# Patient Record
Sex: Female | Born: 1970 | Race: White | Hispanic: No | Smoking: Former smoker
Health system: Southern US, Community
[De-identification: ages and names within clinical notes are randomized; demographics above are authoritative.]

## PROBLEM LIST (undated history)

## (undated) DIAGNOSIS — F32A Depression, unspecified: Secondary | ICD-10-CM

## (undated) DIAGNOSIS — I10 Essential (primary) hypertension: Secondary | ICD-10-CM

## (undated) DIAGNOSIS — F329 Major depressive disorder, single episode, unspecified: Secondary | ICD-10-CM

## (undated) DIAGNOSIS — Z6836 Body mass index (BMI) 36.0-36.9, adult: Secondary | ICD-10-CM

## (undated) DIAGNOSIS — D219 Benign neoplasm of connective and other soft tissue, unspecified: Secondary | ICD-10-CM

## (undated) DIAGNOSIS — K219 Gastro-esophageal reflux disease without esophagitis: Secondary | ICD-10-CM

## (undated) DIAGNOSIS — R002 Palpitations: Secondary | ICD-10-CM

## (undated) DIAGNOSIS — M1990 Unspecified osteoarthritis, unspecified site: Secondary | ICD-10-CM

## (undated) DIAGNOSIS — E669 Obesity, unspecified: Secondary | ICD-10-CM

## (undated) DIAGNOSIS — M419 Scoliosis, unspecified: Secondary | ICD-10-CM

## (undated) DIAGNOSIS — J45909 Unspecified asthma, uncomplicated: Secondary | ICD-10-CM

## (undated) DIAGNOSIS — M719 Bursopathy, unspecified: Secondary | ICD-10-CM

## (undated) DIAGNOSIS — F419 Anxiety disorder, unspecified: Secondary | ICD-10-CM

## (undated) DIAGNOSIS — R079 Chest pain, unspecified: Secondary | ICD-10-CM

## (undated) HISTORY — DX: Bursopathy, unspecified: M71.9

## (undated) HISTORY — DX: Benign neoplasm of connective and other soft tissue, unspecified: D21.9

## (undated) HISTORY — PX: TONSILLECTOMY: SUR1361

## (undated) HISTORY — DX: Body mass index (BMI) 36.0-36.9, adult: Z68.36

## (undated) HISTORY — DX: Unspecified osteoarthritis, unspecified site: M19.90

## (undated) HISTORY — PX: APPENDECTOMY: SHX54

## (undated) HISTORY — DX: Scoliosis, unspecified: M41.9

## (undated) HISTORY — DX: Essential (primary) hypertension: I10

## (undated) HISTORY — DX: Obesity, unspecified: E66.9

## (undated) HISTORY — DX: Chest pain, unspecified: R07.9

## (undated) HISTORY — DX: Gastro-esophageal reflux disease without esophagitis: K21.9

## (undated) HISTORY — DX: Major depressive disorder, single episode, unspecified: F32.9

## (undated) HISTORY — DX: Unspecified asthma, uncomplicated: J45.909

## (undated) HISTORY — DX: Anxiety disorder, unspecified: F41.9

## (undated) HISTORY — DX: Depression, unspecified: F32.A

## (undated) HISTORY — DX: Palpitations: R00.2

---

## 2014-02-07 LAB — PREGNANCY, URINE: Preg Test, Ur: POSITIVE

## 2014-02-15 ENCOUNTER — Encounter: Payer: Self-pay | Admitting: *Deleted

## 2014-02-15 DIAGNOSIS — F32A Depression, unspecified: Secondary | ICD-10-CM

## 2014-02-15 DIAGNOSIS — O3410 Maternal care for benign tumor of corpus uteri, unspecified trimester: Principal | ICD-10-CM

## 2014-02-15 DIAGNOSIS — F419 Anxiety disorder, unspecified: Secondary | ICD-10-CM | POA: Insufficient documentation

## 2014-02-15 DIAGNOSIS — D259 Leiomyoma of uterus, unspecified: Secondary | ICD-10-CM | POA: Insufficient documentation

## 2014-02-15 DIAGNOSIS — M419 Scoliosis, unspecified: Secondary | ICD-10-CM | POA: Insufficient documentation

## 2014-02-15 DIAGNOSIS — F329 Major depressive disorder, single episode, unspecified: Secondary | ICD-10-CM

## 2014-02-15 DIAGNOSIS — O34219 Maternal care for unspecified type scar from previous cesarean delivery: Secondary | ICD-10-CM | POA: Insufficient documentation

## 2014-02-17 ENCOUNTER — Telehealth: Payer: Self-pay | Admitting: *Deleted

## 2014-02-17 NOTE — Telephone Encounter (Signed)
Patient call was transferred to me from the receptionist.  Patient afraid she is having a miscarriage and wants to talk about her options.  Patient hasn't been seen yet in our office and has appointment on 03/09/14.  Patient states she started spotting on Wednesday with some brown discharge and it has gotten some worse since that time.  States that with her previous pregnancies that she carried to term she never had any bleeding so this is very different.  Discussed with patient that she could go to [facility] for evaluation.  Explained that without evaluating her in person it would be impossible to determine whether she is having a miscarriage.  Patient states she is concerned about an emergency room charge.  Patient asked about D&C and the cost.  States she really wants to wait to see how things progress on their own.  Asks if she waits and keeps her appointment on 03/09/14 will the provider listen for the baby's heartbeat and possibly do an ultrasound.  I explained if there was any doubt as to the viability of the baby the provider would probably order an ultrasound.  I explained the process should she go to [facility].  We discussed how much bleeding would be too much and the need to come in.  Patient asks if she can call on Monday morning early to see if there have been any cancellations so she can be seen sooner.  Explained that would be fine.  States she is in [place] and is worried that her insurance may not pay if she comes to [place] for care.  Explained that is not the case and we will file her insurance for her once she is seen.  Patient also very apologetic for outburst with Antoinette, receptionist, this morning.  States she yelled and cussed at her.  States she did apologize to her.  I explained to patient that we understand her frustration and really appreciate her apology.

## 2014-02-21 ENCOUNTER — Telehealth: Payer: Self-pay | Admitting: *Deleted

## 2014-02-21 NOTE — Telephone Encounter (Signed)
Pt left message stating that she spoke with a nurse from our office last week. She is having vaginal bleeding and cramping and believes she passed the baby over the weekend. She has not gone to the hospital for evaluation. She has a scheduled appt on 7/9 and wants to know if there is a sooner appt available. She also stated that a detailed message can be left on her voice mail. I called pt back and left a detailed message. I stated that she may take ibuprofen 600mg  by mouth every 6hrs as needed with food for pain. Also if she has had a miscarriage, some vaginal bleeding is normal. If she is having heavy bleeding which is saturating 1 or more pads per hour for 3 consecutive hours she needs to be seen at a nearby emergency room or come to [facility]. We can wee her at our office on Thursday 6/25 @ 1315 and have scheduled this appt for her. If she has had ED care and no longer needs or desires the appt, please call to cancel.

## 2014-02-23 ENCOUNTER — Encounter: Payer: Self-pay | Admitting: Obstetrics & Gynecology

## 2014-02-23 ENCOUNTER — Ambulatory Visit (INDEPENDENT_AMBULATORY_CARE_PROVIDER_SITE_OTHER): Payer: Medicare HMO | Admitting: Obstetrics & Gynecology

## 2014-02-23 VITALS — BP 123/75 | HR 87 | Temp 100.0°F | Ht 68.0 in | Wt 211.1 lb

## 2014-02-23 DIAGNOSIS — O200 Threatened abortion: Secondary | ICD-10-CM

## 2014-02-23 NOTE — Progress Notes (Signed)
   Subjective:    Patient ID: Chelsea Bush, female    DOB: 10/10/1970, 43 y.o.   MRN: [id number]  HPI  43 yo [id number] here because of heavy bleeding with clots/tissue that started about 5 days ago and has greatly lightened up. Some cramping. She had a miscarriage with her first pregnancy and believes that this is also a miscarriage. She was diagnosed with an early pregnancy about 2 weeks ago at a doctor's office in [place]. She was scheduled to be seen in the high risk clinic in July.  Review of Systems     Objective:   Physical Exam Cervical os closed with a minimal of maroon blood NSSA, NT, no adnexal masses      Assessment & Plan:  Probable miscarriage I will check a type and screen, u/s, and QBHCG

## 2014-02-24 LAB — HCG, QUANTITATIVE, PREGNANCY: hCG, Beta Chain, Quant, S: 673.1 m[IU]/mL

## 2014-02-27 ENCOUNTER — Telehealth: Payer: Self-pay | Admitting: *Deleted

## 2014-02-27 NOTE — Telephone Encounter (Signed)
Patient called and left three messages stating that she has questions. Her doctor left suddenly and she wasn't through talking to her. She wants to know her lab results. Also would like to know if she can have sex or exercise. She also states that she has had a headache. Would like to know if this could be related to anemia in the past.

## 2014-02-27 NOTE — Telephone Encounter (Addendum)
6/29  1544  Called patient and left message to call us back if she still has concerns. 6/30  1535  This pt's concerns were addressed in another telephone encounter message.  Diane Day RNC

## 2014-02-28 ENCOUNTER — Ambulatory Visit (HOSPITAL_COMMUNITY): Payer: Medicare HMO

## 2014-02-28 ENCOUNTER — Telehealth: Payer: Self-pay | Admitting: *Deleted

## 2014-02-28 NOTE — Telephone Encounter (Addendum)
Pt left message stating that she got a return call from our office in response to her previous message. 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 She would like to know her results of recent lab test and also if we could refer her somewhere else to have the [place] since it is so expensive. She also stated that she has been having bad headaches. She requested a call back and that we may leave a detailed message. I returned pt's call and left the following message. I stated that her blood test for pregnancy hormone did have a small amount of pregnancy hormone. This could mean that she has a living, growing pregnancy which is very early or that she has had a miscarriage and the hormone is gradually becoming lower. We need to do another test of the hormone level in order to be sure. She may have it performed on Thursday of this week or any day next week. Please call and schedule an appt for lab draw only. This is extremely important to follow the level of the hormone to make a correct diagnosis. We cannot refer her to any other facility for [place] evaluation if our doctors are going to be making decisions about her care based on the [place] information. She must have the [place] performed at a [facility] facility. In regards to her H/A's, it is common to have them during pregnancy or if the pregnancy hormone is still present in her blood. She may take Tylenol or Ibuprofen according to package directions. If her headache is so severe that she cannot perform normal daily activities, she should be evaluated at an ED. She may call back for additional questions.

## 2014-03-01 ENCOUNTER — Telehealth: Payer: Self-pay | Admitting: General Practice

## 2014-03-01 NOTE — Telephone Encounter (Signed)
Patient called again. Wanted to know if its possible she isn't having a miscarriage and could just have fibroids. Requested that we call back and leave a detailed message.  Called patient back and left detailed message informing her that tomorrows blood test will help [place] to better tell exactly what is going on. Advised that we have to wait and see what the test shows before we can advise on what to do next.

## 2014-03-01 NOTE — Telephone Encounter (Signed)
Patient called and left message stating she would like some clarification on her results and she is wondering if she's still pregnant or not because she thinks she is starting to feel some movement, please call back. Called patient, no answer-left message that we are trying to return your phone call, please call us back at the clinics

## 2014-03-02 ENCOUNTER — Ambulatory Visit (HOSPITAL_COMMUNITY): Payer: BC Managed Care – PPO

## 2014-03-02 ENCOUNTER — Encounter: Payer: Self-pay | Admitting: Obstetrics & Gynecology

## 2014-03-02 ENCOUNTER — Other Ambulatory Visit: Payer: BC Managed Care – PPO

## 2014-03-02 DIAGNOSIS — O200 Threatened abortion: Secondary | ICD-10-CM

## 2014-03-03 LAB — HCG, QUANTITATIVE, PREGNANCY: hCG, Beta Chain, Quant, S: 35.2 m[IU]/mL

## 2014-03-06 ENCOUNTER — Telehealth: Payer: Self-pay | Admitting: *Deleted

## 2014-03-06 NOTE — Telephone Encounter (Signed)
Message copied by [name] on Mon Mar 06, 2014 11:41 AM ------      Message from: Chelsea Bush      Created: Fri Mar 03, 2014 10:42 AM       Please inform patient of decreasing pregnancy hormone. She needs to return in 1 week for repeat quant HCG ------

## 2014-03-06 NOTE — Telephone Encounter (Signed)
Message copied by Geanie Logan on Mon Mar 06, 2014 11:45 AM ------      Message from: Mora Bellman      Created: Fri Mar 03, 2014 10:42 AM       Please inform patient of decreasing pregnancy hormone. She needs to return in 1 week for repeat quant HCG ------

## 2014-03-06 NOTE — Telephone Encounter (Signed)
Patient left message that she is returning our call. Ok to leave message. She would also like to know the numbers from the [facility] recently done and the first one.

## 2014-03-06 NOTE — Telephone Encounter (Signed)
Attempted to call patient. No answer. Left message stating we are calling with results and to schedule a future appointment, please call clinic.

## 2014-03-07 ENCOUNTER — Ambulatory Visit (HOSPITAL_COMMUNITY)
Admission: RE | Admit: 2014-03-07 | Discharge: 2014-03-07 | Disposition: A | Discharge status: Home / Self Care | Payer: BC Managed Care – PPO | Source: Ambulatory Visit | Attending: Obstetrics & Gynecology | Admitting: Obstetrics & Gynecology

## 2014-03-07 DIAGNOSIS — O200 Threatened abortion: Secondary | ICD-10-CM

## 2014-03-07 DIAGNOSIS — D259 Leiomyoma of uterus, unspecified: Secondary | ICD-10-CM | POA: Insufficient documentation

## 2014-03-07 NOTE — Telephone Encounter (Signed)
Called patient and informed her of results. Patient has lab appointment scheduled for 03/09/14. Informed patient she should keep that appointment. Patient asked if she should continue to go to U/S today. Informed patient that Dr. Hulan Fray had ordered it and she should to ensure she has passed all POC. Patient verbalized understanding. No further questions or concerns.

## 2014-03-09 ENCOUNTER — Encounter: Payer: Self-pay | Admitting: Obstetrics & Gynecology

## 2014-03-09 ENCOUNTER — Ambulatory Visit (HOSPITAL_COMMUNITY): Payer: BC Managed Care – PPO

## 2014-03-09 ENCOUNTER — Other Ambulatory Visit: Payer: BC Managed Care – PPO

## 2014-03-15 ENCOUNTER — Telehealth: Payer: Self-pay

## 2014-03-15 NOTE — Telephone Encounter (Signed)
Patient called requesting results of u/s. Called patient and informed her of results of ultrasound but explained that we would still like to see her come in for another HCG lab draw to ensure level is <5. Patient states she has no insurance and is going to be "hit hard" for ultrasound and would like to know if she could just take a home pregnancy test. Informed patient that a negative pregnancy test would be reassuring, however, we still recommend that she come in for the lab draw. Patient states she does not think she will at this time but that she will take a pregnancy test at home and if still presenting positive will consider coming in. Explains she lives in [place], [place] and would have to take work off-- is uninsured-- and states she just cannot afford it. Informed patient I would document she is refusing repeat lab draw but informed to call if she changes her mind. Patient verbalized understanding.

## 2014-03-20 ENCOUNTER — Ambulatory Visit: Payer: Medicare HMO | Admitting: Obstetrics & Gynecology

## 2014-07-03 ENCOUNTER — Encounter: Payer: Self-pay | Admitting: Obstetrics & Gynecology

## 2014-12-21 ENCOUNTER — Encounter (HOSPITAL_COMMUNITY): Payer: Self-pay | Admitting: *Deleted

## 2015-04-30 ENCOUNTER — Encounter: Payer: Self-pay | Admitting: Obstetrics & Gynecology

## 2015-04-30 ENCOUNTER — Ambulatory Visit (INDEPENDENT_AMBULATORY_CARE_PROVIDER_SITE_OTHER): Payer: BLUE CROSS/BLUE SHIELD | Admitting: Obstetrics & Gynecology

## 2015-04-30 VITALS — BP 127/61 | HR 70 | Temp 98.5°F

## 2015-04-30 DIAGNOSIS — R102 Pelvic and perineal pain: Secondary | ICD-10-CM | POA: Diagnosis not present

## 2015-04-30 DIAGNOSIS — N3001 Acute cystitis with hematuria: Secondary | ICD-10-CM | POA: Diagnosis not present

## 2015-04-30 LAB — POCT URINALYSIS DIP (DEVICE)
BILIRUBIN URINE: NEGATIVE
Glucose, UA: NEGATIVE mg/dL
KETONES UR: NEGATIVE mg/dL
Leukocytes, UA: NEGATIVE
NITRITE: NEGATIVE
PH: 5.5 (ref 5.0–8.0)
Protein, ur: NEGATIVE mg/dL
Specific Gravity, Urine: 1.01 (ref 1.005–1.030)
Urobilinogen, UA: 0.2 mg/dL (ref 0.0–1.0)

## 2015-04-30 MED ORDER — CEPHALEXIN 500 MG PO CAPS: 500.0000 mg | ORAL_CAPSULE | Freq: Two times a day (BID) | ORAL | Status: DC

## 2015-04-30 NOTE — Progress Notes (Signed)
Patient ID: Chelsea Bush, female   DOB: 1971-02-12, 44 y.o.   MRN: [id number] Patient is complaining of bladder pain since Friday 8/26, began taking refill prescription for cipro, unkn dosage, bid. Increased urinary leakage per patient.

## 2015-04-30 NOTE — Patient Instructions (Signed)

## 2015-05-01 LAB — URINE CULTURE
Colony Count: NO GROWTH
ORGANISM ID, BACTERIA: NO GROWTH

## 2015-05-03 ENCOUNTER — Encounter: Payer: Self-pay | Admitting: Obstetrics & Gynecology

## 2015-05-03 NOTE — Progress Notes (Signed)
Patient ID: Chelsea Bush, female   DOB: June 04, 1971, 44 y.o.   MRN: [id number] History:  44 y.o. [id number] here today for eval of pelvic pain.  Pt reports a h/o freq UTIs.  She reports that she had Cipro at home and started it 2 days prev with only min relief of sx.  She reports a h/o urine leakage that she thought was just related to having a baby since her mom had similar sx.  She reports dysuria and urinary freq.  She reports that the pain is much worse if her child accidentally kicks or hits her abd.  No fever or chills  The following portions of the patient's history were reviewed and updated as appropriate: allergies, current medications, past family history, past medical history, past social history, past surgical history and problem list.  Review of Systems:  Pertinent items are noted in HPI.  Objective:  Physical Exam Blood pressure 127/61, pulse 70, temperature 98.5 F (36.9 C), last menstrual period 04/20/2015, unknown if currently breastfeeding. Gen: NAD Abd: Soft, suprapubic tenderness to palpation; nondistended   Labs and Imaging UA + hemoglobin; neg nitrates or leuk  Assessment & Plan:  New onset urinary sx- suspect UTI.  Pt has been on Cipro 2-3 days.  Suspect that there could be partial treatment possibly due to resistance.  Will obtain urine cx Keflex 500 mg bid x 7 days Pelvic pain and incontinence rec eval after UTI is cleared.  Rec that pt f/u with Dr. Hulan Fray who does most of our incontinence procedures   Mekala Winger L. Harraway-Smith, M.D., Cherlynn June

## 2015-05-10 IMAGING — US US PELVIS COMPLETE
1 series · 14 of 25 positions shown · non-contrast
Comparison: None

CLINICAL DATA: History of fibroids. Suspect spontaneous abortion.
Evaluate for retained products of conception.



[Series 1: us pelvis complete · 14 of 55 slices shown]
[im 1/55]
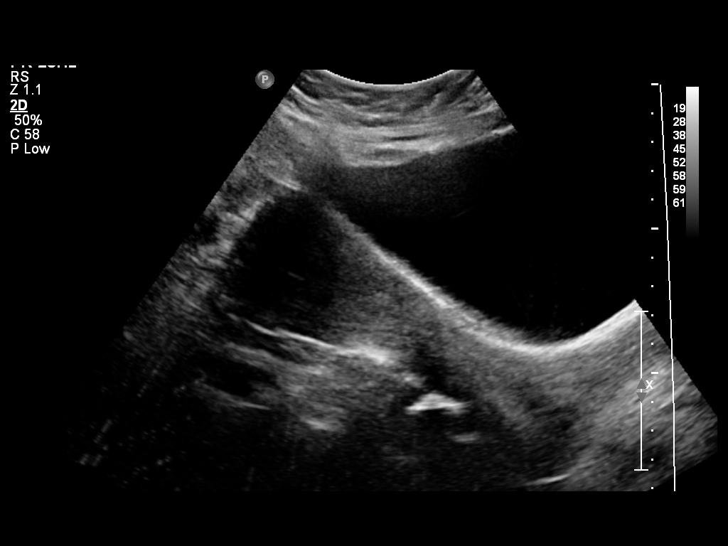
[im 5/55]
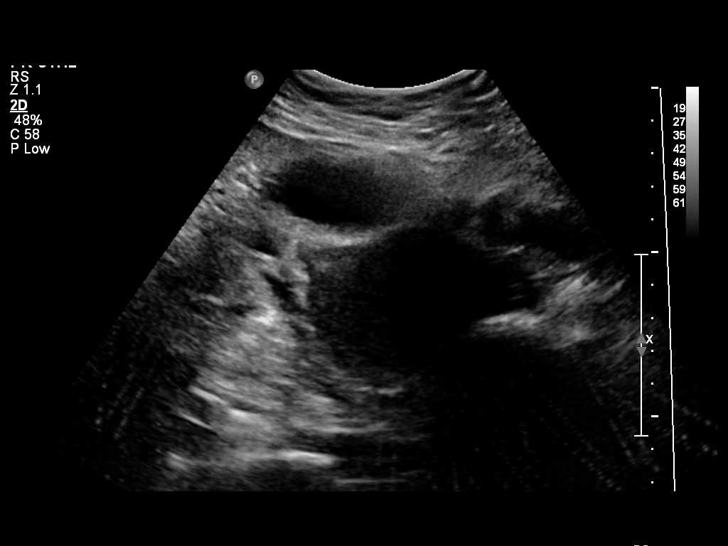
[im 10/55]
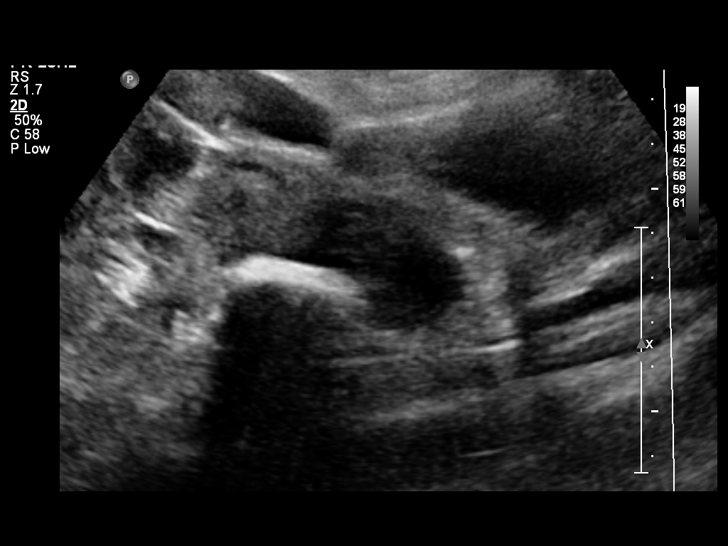
[im 14/55]
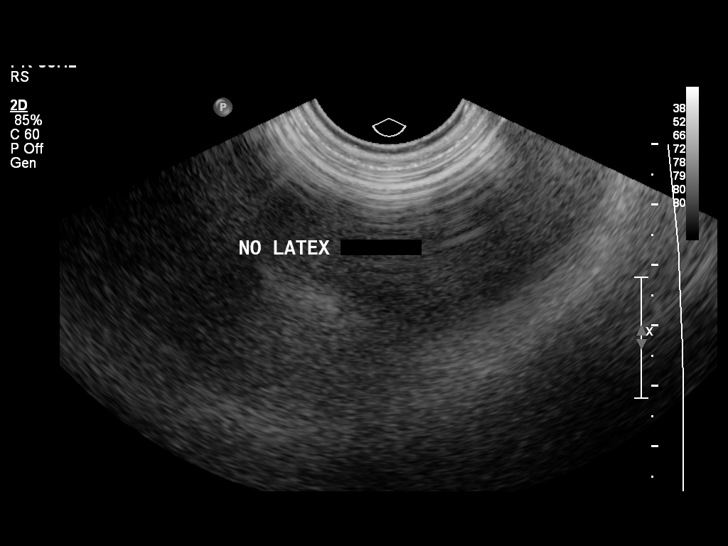
[im 19/55]
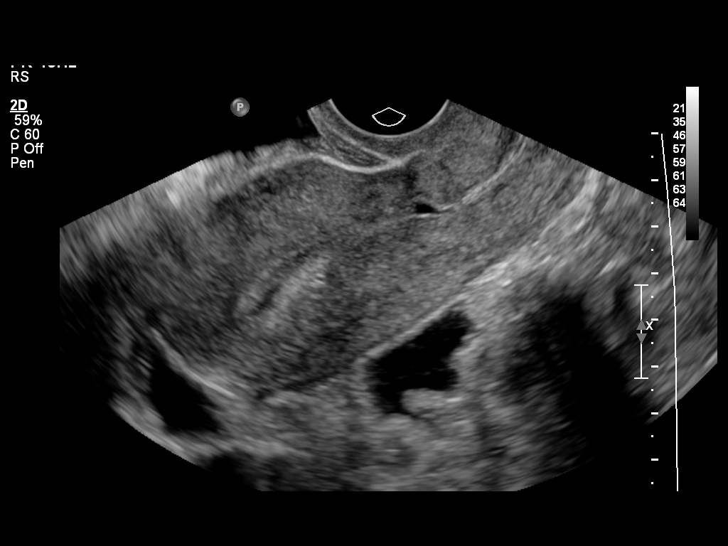
[im 21/55]
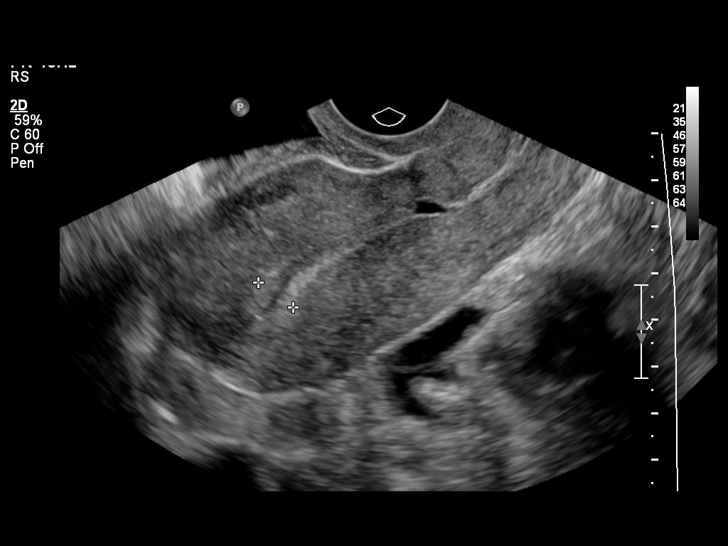
[im 25/55]
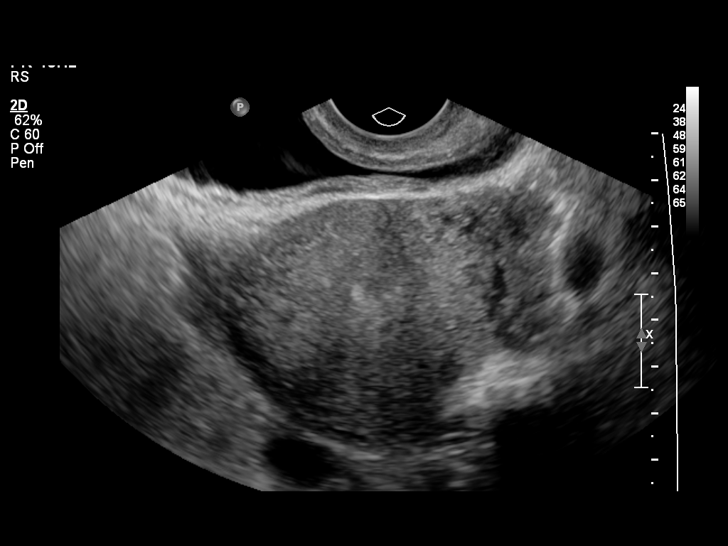
[im 30/55]
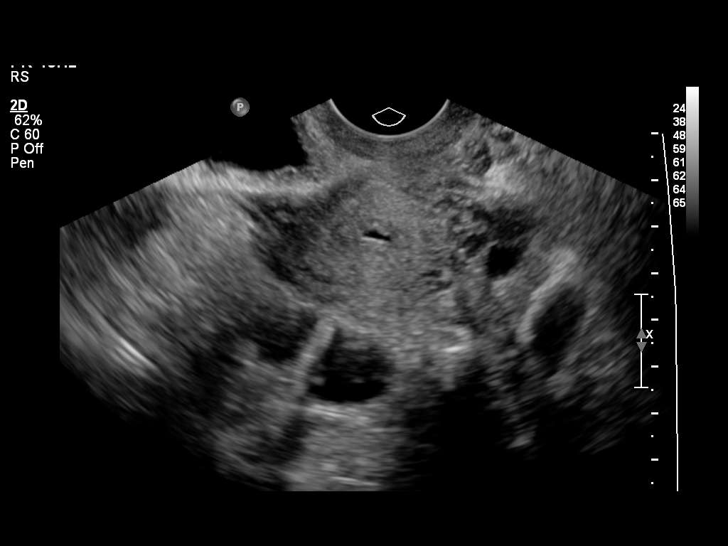
[im 34/55]
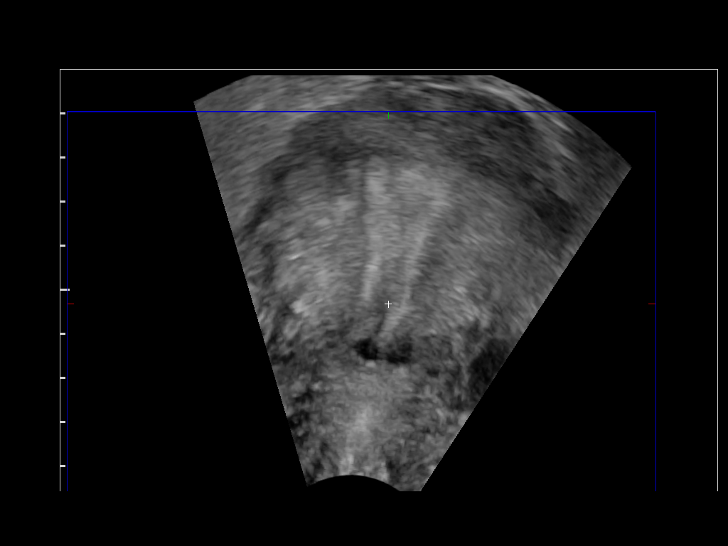
[im 37/55]
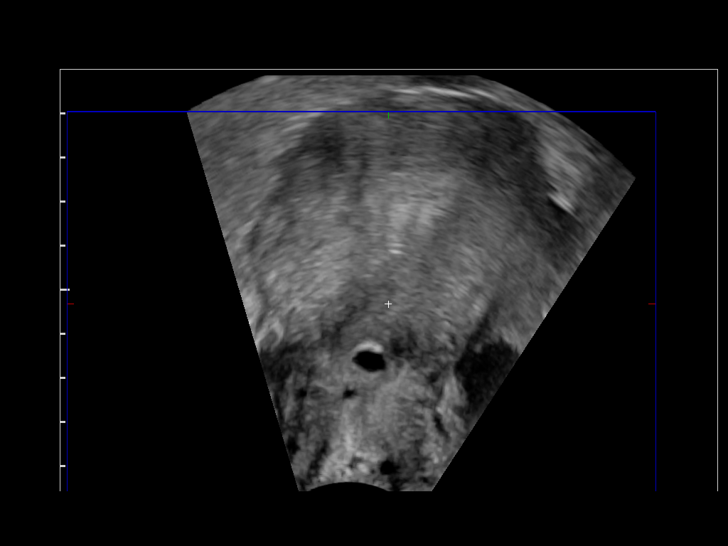
[im 41/55]
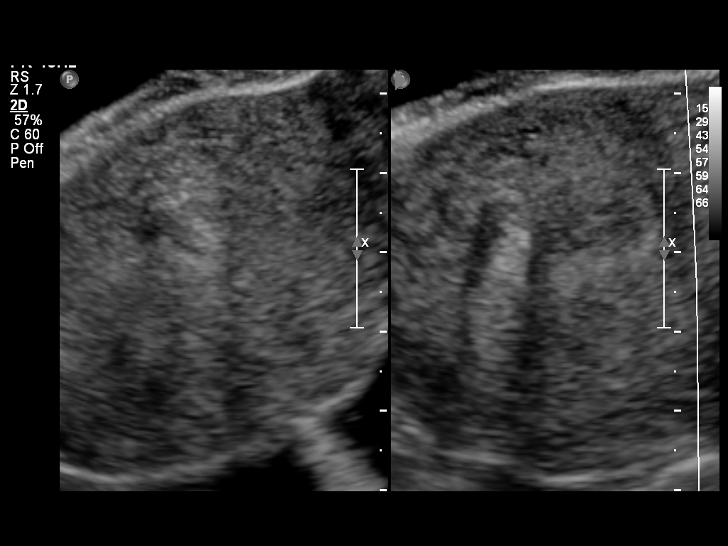
[im 46/55]
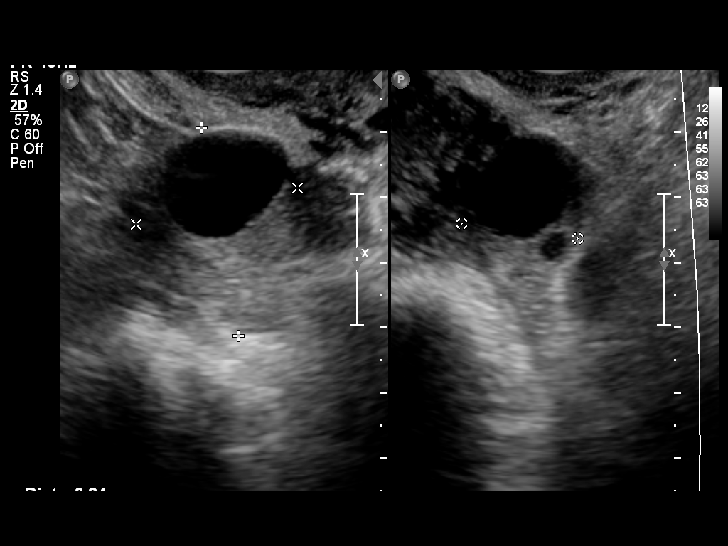
[im 50/55]
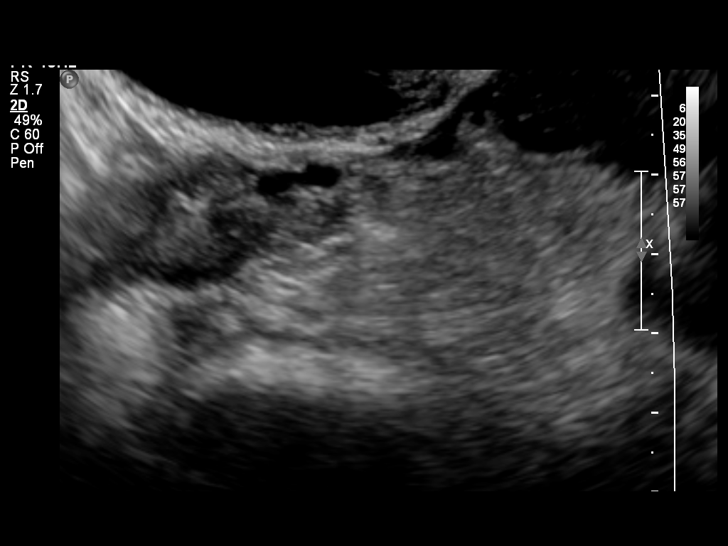
[im 55/55]
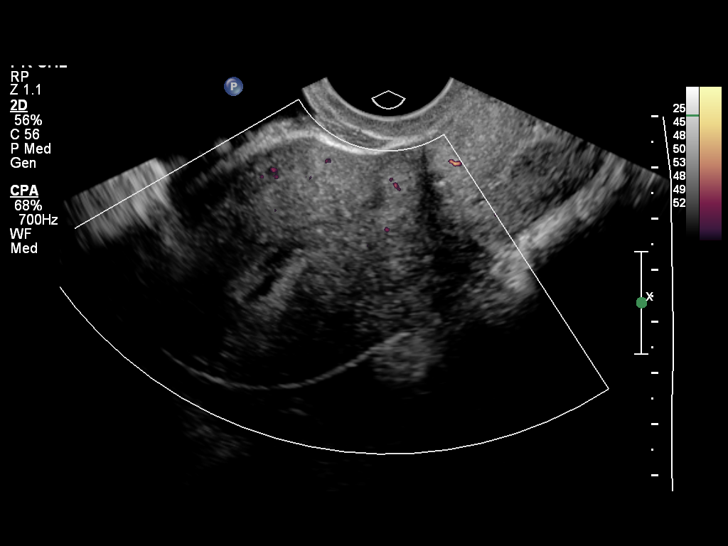

[14 of 25 positions shown; findings below may reference images not displayed]

FINDINGS: Uterus

Measurements: 11.4 x 4.5 x 6.3 cm.. Fibroid within the right lateral
myometrium measures 1.3 x 0.8 x 0.9 cm.

Endometrium

Thickness: 10 mm.. Small amount of anechoic fluid is identified
within the lower uterine segment.

Right ovary

Measurements: 3.3 x 2.5 x 1.8 cm. Normal appearance/no adnexal mass.

Left ovary

Measurements: 1.8 x 1.3 x 1.5 cm. Normal appearance/no adnexal mass.

Other findings

No free fluid.
IMPRESSION: 1. No evidence for retained products of conception.
2. A small amount of anechoic fluid is noted within the lower
uterine segment.

## 2015-05-21 ENCOUNTER — Ambulatory Visit: Payer: BLUE CROSS/BLUE SHIELD | Admitting: Obstetrics & Gynecology

## 2022-11-20 ENCOUNTER — Ambulatory Visit: Payer: BLUE CROSS/BLUE SHIELD | Attending: Internal Medicine | Admitting: Internal Medicine

## 2022-11-20 ENCOUNTER — Encounter: Payer: Self-pay | Admitting: Internal Medicine

## 2022-11-20 VITALS — BP 118/68 | HR 66 | Ht 67.0 in | Wt 241.0 lb

## 2022-11-20 DIAGNOSIS — R072 Precordial pain: Secondary | ICD-10-CM | POA: Diagnosis not present

## 2022-11-20 DIAGNOSIS — F419 Anxiety disorder, unspecified: Secondary | ICD-10-CM

## 2022-11-20 DIAGNOSIS — I10 Essential (primary) hypertension: Secondary | ICD-10-CM | POA: Insufficient documentation

## 2022-11-20 DIAGNOSIS — E782 Mixed hyperlipidemia: Secondary | ICD-10-CM | POA: Diagnosis not present

## 2022-11-20 DIAGNOSIS — R0601 Orthopnea: Secondary | ICD-10-CM | POA: Insufficient documentation

## 2022-11-20 MED ORDER — SPIRONOLACTONE 25 MG PO TABS: 25.0000 mg | ORAL_TABLET | Freq: Every day | ORAL | 3 refills | Status: AC

## 2022-11-20 MED ORDER — METOPROLOL TARTRATE 100 MG PO TABS: ORAL_TABLET | ORAL | 0 refills | Status: AC

## 2022-11-20 NOTE — Progress Notes (Signed)
Cardiology Office Note:    Date:  11/20/2022   ID:  Chelsea Bush, DOB Feb 13, 1971, MRN [id number]  PCP:  Neale Burly, MD   [facility] Providers Cardiologist:  None     Referring MD: Neale Burly, MD   CC: Chest pain Consulted for the evaluation of chest pain  at the behest of Dr. Sherrie Sport  History of Present Illness:    Chelsea Bush is a 52 y.o. female with a hx of HTN, morbid obesity, and HLD, who presents for chest pain evaluation.  Patient notes that she is feeling poorly.   Since she had the COVID-19 vaccine, she has had chest pain.   It feels like a burning chest pain.  Sometimes it is going down her arm.  The pain is constant and comes and goes a few hours and a few days.  Improved on taking Omega 3 Fatty acids and ASA.  Worse with stress. Started walking with no chest pain  Notes orthopnea.  Feels like she is wheezing and is propped up on inhalers.  SOB predominately laying down.   No cough.  Rare ankle swelling. Worse with alcohol. She is trying to change her diet and cut down caffeine and [facility].   Patient reports prior cardiac testing including normal TSH.  She has new palpitations and slow heart rates that occur every two weeks.  Notes that she gets leg pain independent of exertion.  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  Past Medical History:  Diagnosis Date   Anxiety    Arthritis    Asthma    BMI 36.0-36.9,adult    Bursitis    Chest pain    Depression    Fibroids    GERD (gastroesophageal reflux disease)    HTN (hypertension)    Obesity    Palpitations    Scoliosis     Past Surgical History:  Procedure Laterality Date   APPENDECTOMY     CESAREAN SECTION     TONSILLECTOMY      Current Medications: Current Meds  Medication Sig   albuterol (VENTOLIN HFA) 108 (90 Base) MCG/ACT inhaler Inhale 1 puff into the lungs 4 (four) times daily as needed for wheezing or shortness of breath.   budesonide-formoterol (SYMBICORT)  160-4.5 MCG/ACT inhaler Inhale 2 puffs into the lungs daily.   clonazePAM (KLONOPIN) 0.5 MG tablet 0.25 mg daily.   EPINEPHrine 0.3 mg/0.3 mL IJ SOAJ injection as needed for anaphylaxis.   famotidine (PEPCID) 40 MG tablet Take 40 mg by mouth daily as needed for heartburn or indigestion.   metoprolol tartrate (LOPRESSOR) 100 MG tablet Take 2 hours prior to Cardiac CT   montelukast (SINGULAIR) 10 MG tablet Take 10 mg by mouth at bedtime.   omeprazole (PRILOSEC) 20 MG capsule Take 20 mg by mouth as needed (heartburn).   oxyCODONE-acetaminophen (PERCOCET) 7.5-325 MG per tablet Take 1 tablet by mouth 4 (four) times daily as needed for pain.   Semaglutide-Weight Management (WEGOVY) 0.25 MG/0.5ML SOAJ Inject 0.25 mg into the skin every 7 (seven) days.   [DISCONTINUED] cephALEXin (KEFLEX) 500 MG capsule Take 1 capsule (500 mg total) by mouth 2 (two) times daily.   [DISCONTINUED] ciprofloxacin (CIPRO) 500 MG tablet Take 500 mg by mouth 2 (two) times daily.   [DISCONTINUED] diphenhydrAMINE (BENADRYL) 12.5 MG/5ML liquid Take 6.25 mg by mouth daily as needed.   [DISCONTINUED] Docosahexaenoic Acid (DHA PO) Take 2 tablets by mouth daily.   [DISCONTINUED] escitalopram (LEXAPRO) 10 MG tablet  Take 10 mg by mouth daily.   [DISCONTINUED] hydrocortisone (ANUSOL-HC) 25 MG suppository Place 25 mg rectally 2 (two) times daily.   [DISCONTINUED] metoprolol succinate (TOPROL-XL) 25 MG 24 hr tablet Take 25 mg by mouth daily.   [DISCONTINUED] Prenatal Vit-Fe Fumarate-FA (PRENATAL VITAMINS PLUS) 27-1 MG TABS Take 1 tablet by mouth daily.   [DISCONTINUED] spironolactone (ALDACTONE) 25 MG tablet Take 25 mg by mouth daily.     Allergies:   Erythromycin   Social History   Socioeconomic History   Marital status: Divorced    Spouse name: Not on file   Number of children: Not on file   Years of education: Not on file   Highest education level: Not on file  Occupational History   Not on file  Tobacco Use   Smoking  status: Former    Types: Cigarettes    Quit date: 09/01/2001    Years since quitting: 21.2   Smokeless tobacco: Never  Substance and Sexual Activity   Alcohol use: Yes    Comment: occasional   Drug use: No   Sexual activity: Yes    Birth control/protection: None  Other Topics Concern   Not on file  Social History Narrative   Not on file   Social Determinants of Health   Financial Resource Strain: Not on file  Food Insecurity: Not on file  Transportation Needs: Not on file  Physical Activity: Not on file  Stress: Not on file  Social Connections: Not on file   Social: son has unique health needs and needs medical care   Family History: The patient's family history includes Hyperlipidemia in her mother; Hypertension in her father.  ROS:   Please see the history of present illness.     All other systems reviewed and are negative.  EKGs/Labs/Other Studies Reviewed:    The following studies were reviewed today:  EKG:  EKG is  ordered today.  The ekg ordered today demonstrates  11/20/2022: SR rate 66    Recent Labs: No results found for requested labs within last 365 days.  Recent Lipid Panel No results found for: "CHOL", "TRIG", "HDL", "CHOLHDL", "VLDL", "LDLCALC", "LDLDIRECT"  Physical Exam:    VS:  BP 118/68   Pulse 66   Ht 5\' 7"  (1.702 m)   Wt 241 lb (109.3 kg)   SpO2 98%   BMI 37.75 kg/m     Wt Readings from Last 3 Encounters:  11/20/22 241 lb (109.3 kg)  02/23/14 211 lb 1.6 oz (95.8 kg)    GEN: No distress morbid obesity HEENT: Normal NECK: No JVD CARDIAC: RRR, no murmurs, rubs, gallops RESPIRATORY:  Clear to auscultation without rales, wheezing or rhonchi  ABDOMEN: Soft, non-tender, non-distended MUSCULOSKELETAL:  +1 bilateral edema; No deformity  SKIN: Warm and dry NEUROLOGIC:  Alert and oriented x 3 PSYCHIATRIC:  anxious affect and mood  ASSESSMENT:    1. Precordial pain   2. Orthopnea   3. Anxiety   4. Mixed hyperlipidemia   5. Essential  hypertension   6. Morbid obesity ([place])     PLAN:    Chest pain - will get CCTA  Isolated orthopnea - echo with Bubble  - and BNP  Palpitations and bradycardia - if other testing looks good and symptoms persistent, will send ziopatch 7 days   Leg pain  - has good distal pulses, if high CAC burden will get duplex  HLD - discuss lifestyle interventions  Morbid obesity HTN - On Wegovy with PCP - off MRA  was off      Medication Adjustments/Labs and Tests Ordered: Current medicines are reviewed at length with the patient today.  Concerns regarding medicines are outlined above.  Orders Placed This Encounter  Procedures   CT CORONARY MORPH W/CTA COR W/SCORE W/CA W/CM &/OR WO/CM   Basic metabolic panel   Pro b natriuretic peptide (BNP)   EKG 12-Lead   ECHOCARDIOGRAM LIMITED BUBBLE STUDY   Meds ordered this encounter  Medications   spironolactone (ALDACTONE) 25 MG tablet    Sig: Take 1 tablet (25 mg total) by mouth daily.    Dispense:  90 tablet    Refill:  3   metoprolol tartrate (LOPRESSOR) 100 MG tablet    Sig: Take 2 hours prior to Cardiac CT    Dispense:  1 tablet    Refill:  0    Patient Instructions  Medication Instructions:  Your physician has recommended you make the following change in your medication:  RESTART: spironolactone (Aldactone) 25 mg by mouth once daily  *If you need a refill on your cardiac medications before your next appointment, please call your pharmacy*   Lab Work: TODAY: BMP, BNP  If you have labs (blood work) drawn today and your tests are completely normal, you will receive your results only by: [place] (if you have MyChart) OR A paper copy in the mail If you have any lab test that is abnormal or we need to change your treatment, we will call you to review the results.   Testing/Procedures: Your physician has requested that you have cardiac CT. Cardiac computed tomography (CT) is a painless test that uses an x-ray machine  to take clear, detailed pictures of your heart. For further information please visit [email or web address].[email or web address]. Please follow instruction sheet as given.   Your physician has requested that you have an Echocardiogram Bubble Study.   Follow-Up: At [facility], you and your health needs are our priority.  As part of our continuing mission to provide you with exceptional heart care, we have created designated Provider Care Teams.  These Care Teams include your primary Cardiologist (physician) and Advanced Practice Providers (APPs -  Physician Assistants and Nurse Practitioners) who all work together to provide you with the care you need, when you need it.  We recommend signing up for the patient portal called "MyChart".  Sign up information is provided on this After Visit Summary.  MyChart is used to connect with patients for Virtual Visits (Telemedicine).  Patients are able to view lab/test results, encounter notes, upcoming appointments, etc.  Non-urgent messages can be sent to your provider as well.   To learn more about what you can do with MyChart, go to [email or web address].    Your next appointment:   3-4 month(s)  Provider:   Nicholes Rough, PA-C, Melina Copa, PA-C, or Ermalinda Barrios, PA-C      Other Instructions   Your cardiac CT will be scheduled at one of the below locations:   [facility] [address] [place], [place] [address] [phone]  [place] [address] [place], [place] [address] [phone]  [facility] [place], [place] [address] [phone]  If scheduled at [facility], please arrive at the [facility] and Children's Entrance (Entrance C2) of [facility] 30 minutes prior to test start time. You can use the FREE valet parking offered at entrance C (encouraged to control the  heart rate for the test)  Proceed to the [facility]  Radiology Department (first floor) to check-in and test prep.  All radiology patients and guests should use entrance C2 at [facility], accessed from [facility], even though the hospital's physical address listed is [address]    If scheduled at [facility] or [facility], please arrive 15 mins early for check-in and test prep.   Please follow these instructions carefully (unless otherwise directed):  Hold all erectile dysfunction medications at least 3 days (72 hrs) prior to test. (Ie viagra, cialis, sildenafil, tadalafil, etc) We will administer nitroglycerin during this exam.   On the Night Before the Test: Be sure to Drink plenty of water. Do not consume any caffeinated/decaffeinated beverages or chocolate 12 hours prior to your test. Do not take any antihistamines 12 hours prior to your test.- - Singular   On the Day of the Test: Drink plenty of water until 1 hour prior to the test. Do not eat any food 1 hour prior to test. You may take your regular medications prior to the test.  Take metoprolol (Lopressor) 100 mg by mouth two hours prior to test. If you take Furosemide/Hydrochlorothiazide/Spironolactone, please HOLD on the morning of the test. FEMALES- please wear underwire-free bra if available, avoid dresses & tight clothing       After the Test: Drink plenty of water. After receiving IV contrast, you may experience a mild flushed feeling. This is normal. On occasion, you may experience a mild rash up to 24 hours after the test. This is not dangerous. If this occurs, you can take Benadryl 25 mg and increase your fluid intake. If you experience trouble breathing, this can be serious. If it is severe call 911 IMMEDIATELY. If it is mild, please call our office. If you take any of these medications: Glipizide/Metformin, Avandament, Glucavance, please do not take 48 hours after completing test unless  otherwise instructed.  We will call to schedule your test 2-4 weeks out understanding that some insurance companies will need an authorization prior to the service being performed.   For non-scheduling related questions, please contact the cardiac imaging nurse navigator should you have any questions/concerns: Marchia Bond, Cardiac Imaging Nurse Navigator Gordy Clement, Cardiac Imaging Nurse Navigator [place] [facility] Direct Office Dial: [phone]   For scheduling needs, including cancellations and rescheduling, please call [place], [phone].     Signed, Werner Lean, MD  11/20/2022 12:23 PM    [place]

## 2022-11-20 NOTE — Patient Instructions (Signed)
Medication Instructions:  Your physician has recommended you make the following change in your medication:  RESTART: spironolactone (Aldactone) 25 mg by mouth once daily  *If you need a refill on your cardiac medications before your next appointment, please call your pharmacy*   Lab Work: TODAY: BMP, BNP  If you have labs (blood work) drawn today and your tests are completely normal, you will receive your results only by: [place] (if you have MyChart) OR A paper copy in the mail If you have any lab test that is abnormal or we need to change your treatment, we will call you to review the results.   Testing/Procedures: Your physician has requested that you have cardiac CT. Cardiac computed tomography (CT) is a painless test that uses an x-ray machine to take clear, detailed pictures of your heart. For further information please visit [email or web address].[email or web address]. Please follow instruction sheet as given.   Your physician has requested that you have an Echocardiogram Bubble Study.   Follow-Up: At [facility], you and your health needs are our priority.  As part of our continuing mission to provide you with exceptional heart care, we have created designated Provider Care Teams.  These Care Teams include your primary Cardiologist (physician) and Advanced Practice Providers (APPs -  Physician Assistants and Nurse Practitioners) who all work together to provide you with the care you need, when you need it.  We recommend signing up for the patient portal called "MyChart".  Sign up information is provided on this After Visit Summary.  MyChart is used to connect with patients for Virtual Visits (Telemedicine).  Patients are able to view lab/test results, encounter notes, upcoming appointments, etc.  Non-urgent messages can be sent to your provider as well.   To learn more about what you can do with MyChart, go to [email or web address].    Your next appointment:   3-4  month(s)  Provider:   Nicholes Rough, PA-C, Melina Copa, PA-C, or Ermalinda Barrios, PA-C      Other Instructions   Your cardiac CT will be scheduled at one of the below locations:   [facility] [address] [place], [place] [address] [phone]  [place] [address] [place], [place] [address] [phone]  [facility] [place], [place] [address] [phone]  If scheduled at [facility], please arrive at the [facility] and Children's Entrance (Entrance C2) of [facility] 30 minutes prior to test start time. You can use the FREE valet parking offered at entrance C (encouraged to control the heart rate for the test)  Proceed to the [facility] Radiology Department (first floor) to check-in and test prep.  All radiology patients and guests should use entrance C2 at [facility], accessed from [facility], even though the hospital's physical address listed is [address].    If scheduled at [facility] or [facility], please arrive 15 mins early for check-in and test prep.   Please follow these instructions carefully (unless otherwise directed):  Hold all erectile dysfunction medications at least 3 days (72 hrs) prior to test. (Ie viagra, cialis, sildenafil, tadalafil, etc) We will administer nitroglycerin during this exam.   On the Night Before the Test: Be sure to Drink plenty of water. Do not consume any caffeinated/decaffeinated beverages or chocolate 12 hours prior to your test. Do not take any antihistamines 12  hours prior to your test.- - Singular   On the Day of the Test: Drink plenty of water until 1 hour prior to the test. Do not eat any food 1 hour prior to test. You may take your regular medications prior to the test.  Take metoprolol (Lopressor) 100 mg by  mouth two hours prior to test. If you take Furosemide/Hydrochlorothiazide/Spironolactone, please HOLD on the morning of the test. FEMALES- please wear underwire-free bra if available, avoid dresses & tight clothing       After the Test: Drink plenty of water. After receiving IV contrast, you may experience a mild flushed feeling. This is normal. On occasion, you may experience a mild rash up to 24 hours after the test. This is not dangerous. If this occurs, you can take Benadryl 25 mg and increase your fluid intake. If you experience trouble breathing, this can be serious. If it is severe call 911 IMMEDIATELY. If it is mild, please call our office. If you take any of these medications: Glipizide/Metformin, Avandament, Glucavance, please do not take 48 hours after completing test unless otherwise instructed.  We will call to schedule your test 2-4 weeks out understanding that some insurance companies will need an authorization prior to the service being performed.   For non-scheduling related questions, please contact the cardiac imaging nurse navigator should you have any questions/concerns: Marchia Bond, Cardiac Imaging Nurse Navigator Gordy Clement, Cardiac Imaging Nurse Navigator [place] [facility] Direct Office Dial: [phone]   For scheduling needs, including cancellations and rescheduling, please call [place], [phone].

## 2022-11-21 LAB — BASIC METABOLIC PANEL
BUN/Creatinine Ratio: 15 (ref 9–23)
BUN: 12 mg/dL (ref 6–24)
CO2: 26 mmol/L (ref 20–29)
Calcium: 9.7 mg/dL (ref 8.7–10.2)
Chloride: 101 mmol/L (ref 96–106)
Creatinine, Ser: 0.81 mg/dL (ref 0.57–1.00)
Glucose: 91 mg/dL (ref 70–99)
Potassium: 4.3 mmol/L (ref 3.5–5.2)
Sodium: 141 mmol/L (ref 134–144)
eGFR: 88 mL/min/{1.73_m2} (ref >59)

## 2022-11-21 LAB — PRO B NATRIURETIC PEPTIDE: NT-Pro BNP: <36 pg/mL (ref 0–249)

## 2022-11-25 ENCOUNTER — Telehealth (HOSPITAL_COMMUNITY): Payer: Self-pay | Admitting: Emergency Medicine

## 2022-11-25 ENCOUNTER — Encounter (HOSPITAL_COMMUNITY): Payer: Self-pay

## 2022-11-25 DIAGNOSIS — R079 Chest pain, unspecified: Secondary | ICD-10-CM

## 2022-11-25 MED ORDER — PREDNISONE 50 MG PO TABS: ORAL_TABLET | ORAL | 0 refills | Status: AC

## 2022-11-25 MED ORDER — DIPHENHYDRAMINE HCL 50 MG PO TABS: ORAL_TABLET | ORAL | 0 refills | Status: AC

## 2022-11-25 NOTE — Telephone Encounter (Signed)
Pt with alpha gal allergy and concerned about possible reaction to glycerin in nitroglycerin.   13 hr prep prescribed just in case. Will send mychart message with instructions.  New appt 12/05/22   Marchia Bond RN Navigator Cardiac Imaging [facility] [phone] Office  [phone] Cell

## 2022-11-26 ENCOUNTER — Ambulatory Visit (HOSPITAL_COMMUNITY): Payer: BC Managed Care – PPO

## 2022-12-04 ENCOUNTER — Telehealth (HOSPITAL_COMMUNITY): Payer: Self-pay | Admitting: *Deleted

## 2022-12-04 NOTE — Telephone Encounter (Signed)
Reaching out to patient to offer assistance regarding upcoming cardiac imaging study; pt verbalizes understanding of appt date/time, parking situation and where to check in, pre-test NPO status and medications ordered, and verified current allergies; name and call back number provided for further questions should they arise  Gordy Clement RN Navigator Cardiac Imaging [facility] [phone] office [phone] cell  Patient states that she can tolerate jello and other small amounts of mammalian products without incident. Would prefer to not take 13hour prep.  She will take 50-100mg  metoprolol tartrate two hours prior based upon HR. She is aware to arrive at 2pm.

## 2022-12-05 ENCOUNTER — Ambulatory Visit (HOSPITAL_COMMUNITY)
Admission: RE | Admit: 2022-12-05 | Discharge: 2022-12-05 | Disposition: A | Discharge status: Home / Self Care | Payer: BC Managed Care – PPO | Source: Ambulatory Visit | Attending: Internal Medicine | Admitting: Internal Medicine

## 2022-12-05 DIAGNOSIS — R072 Precordial pain: Secondary | ICD-10-CM

## 2022-12-05 MED ORDER — NITROGLYCERIN 0.4 MG SL SUBL
SUBLINGUAL_TABLET | SUBLINGUAL | Status: AC
  Filled 2022-12-05: qty 2

## 2022-12-05 MED ORDER — IOHEXOL 350 MG/ML SOLN
95.0000 mL | Freq: Once | INTRAVENOUS | Status: AC | PRN
  Administered 2022-12-05: 95 mL via INTRAVENOUS

## 2022-12-05 MED ORDER — NITROGLYCERIN 0.4 MG SL SUBL
0.8000 mg | SUBLINGUAL_TABLET | Freq: Once | SUBLINGUAL | Status: AC
  Administered 2022-12-05: 0.8 mg via SUBLINGUAL

## 2022-12-08 ENCOUNTER — Encounter: Payer: Self-pay | Admitting: Internal Medicine

## 2022-12-11 ENCOUNTER — Ambulatory Visit (HOSPITAL_COMMUNITY): Payer: BC Managed Care – PPO | Attending: Internal Medicine

## 2022-12-11 ENCOUNTER — Encounter: Payer: Self-pay | Admitting: Internal Medicine

## 2022-12-11 ENCOUNTER — Other Ambulatory Visit: Payer: Self-pay | Admitting: Internal Medicine

## 2022-12-11 DIAGNOSIS — E782 Mixed hyperlipidemia: Secondary | ICD-10-CM

## 2022-12-11 DIAGNOSIS — I10 Essential (primary) hypertension: Secondary | ICD-10-CM

## 2022-12-11 DIAGNOSIS — R0601 Orthopnea: Secondary | ICD-10-CM

## 2022-12-11 DIAGNOSIS — R072 Precordial pain: Secondary | ICD-10-CM

## 2022-12-11 DIAGNOSIS — F419 Anxiety disorder, unspecified: Secondary | ICD-10-CM

## 2022-12-11 LAB — ECHOCARDIOGRAM COMPLETE
Area-P 1/2: 3.13 cm2
S' Lateral: 2.3 cm

## 2023-02-25 ENCOUNTER — Ambulatory Visit: Payer: BC Managed Care – PPO | Admitting: Physician Assistant

## 2023-04-26 NOTE — Progress Notes (Deleted)
Cardiology Office Note:  .   Date:  04/26/2023  ID:  Chelsea Bush, DOB 09-17-1970, MRN [id number] PCP: Toma Deiters, MD  [facility] Providers Cardiologist:  None {  History of Present Illness: .   Chelsea Bush is a 52 y.o. female with a past medical history of hypertension, morbid obesity, and HLD who presented for chest pain evaluation March 2024.  Here for follow-up appointment.  Patient at that time noted she was feeling poorly.  Since she had had the COVID-19 vaccine she has had chest pain.  Feels like a burning sensation.  Sometimes goes down her arm.  Pain is constant it comes and goes a few hours and a few days.  Improves on taking omega-3 fatty acids and ASA.  Worse with stress.  Started walking without any chest pain.  She did note orthopnea.  Felt like she is wheezing and is propped up in 1 inhalers.  Shortness of breath predominantly laying down.  No cough.  Rare ankle swelling.  Worse with alcohol.  She is trying to change her diet and cut down caffeine and [facility].  Patient reported prior cardiac testing including normal TSH.  Also stated she has new palpitations and slow heart rates that occur every 2 weeks.  She notes that she gets leg pain independent of exertion.  Notes that she has alpha gal.  Today, she***  ROS: ***  Studies Reviewed: .   Echocardiogram/11/24     IMPRESSIONS     1. Left ventricular ejection fraction, by estimation, is 60 to 65%. Left  ventricular ejection fraction by 3D volume is 67 %. The left ventricle has  normal function. The left ventricle has no regional wall motion  abnormalities. There is mild left  ventricular hypertrophy. Left ventricular diastolic parameters were  normal.   2. Right ventricular systolic function is normal. The right ventricular  size is normal. There is normal pulmonary artery systolic pressure.   3. No evidence of mitral valve regurgitation.   4. The aortic valve was not well visualized.  Aortic valve regurgitation  is not visualized.   5. The inferior vena cava is normal in size with greater than 50%  respiratory variability, suggesting right atrial pressure of 3 mmHg.   FINDINGS   Left Ventricle: Left ventricular ejection fraction, by estimation, is 60  to 65%. Left ventricular ejection fraction by 3D volume is 67 %. The left  ventricle has normal function. The left ventricle has no regional wall  motion abnormalities. The left  ventricular internal cavity size was normal in size. There is mild left  ventricular hypertrophy. Left ventricular diastolic parameters were  normal.   Right Ventricle: The right ventricular size is normal. Right ventricular  systolic function is normal. There is normal pulmonary artery systolic  pressure. The tricuspid regurgitant velocity is 2.17 m/s, and with an  assumed right atrial pressure of 3 mmHg,   the estimated right ventricular systolic pressure is 21.8 mmHg.   Left Atrium: Left atrial size was normal in size.   Right Atrium: Right atrial size was normal in size.   Pericardium: There is no evidence of pericardial effusion.   Mitral Valve: No evidence of mitral valve regurgitation.   Tricuspid Valve: Tricuspid valve regurgitation is mild.   Aortic Valve: The aortic valve was not well visualized. Aortic valve  regurgitation is not visualized.   Pulmonic Valve: Pulmonic valve regurgitation is not visualized.   Aorta: The aortic root and ascending aorta  are structurally normal, with  no evidence of dilitation.   Venous: The inferior vena cava is normal in size with greater than 50%  respiratory variability, suggesting right atrial pressure of 3 mmHg.   IAS/Shunts: No atrial level shunt detected by color flow Doppler.  Risk Assessment/Calculations:   {Does this patient have ATRIAL FIBRILLATION?:[id number]} No BP recorded.  {Refresh Note OR Click here to enter BP  :1}***       Physical Exam:   VS:  There were no vitals  taken for this visit.   Wt Readings from Last 3 Encounters:  11/20/22 241 lb (109.3 kg)  02/23/14 211 lb 1.6 oz (95.8 kg)    GEN: Well nourished, well developed in no acute distress NECK: No JVD; No carotid bruits CARDIAC: ***RRR, no murmurs, rubs, gallops RESPIRATORY:  Clear to auscultation without rales, wheezing or rhonchi  ABDOMEN: Soft, non-tender, non-distended EXTREMITIES:  No edema; No deformity   ASSESSMENT AND PLAN: .   1.  Precordial pain 2.  Orthopnea 3.  Anxiety 4.  Hyperlipidemia 5.  HTN 6.  Morbid obesity    {Are you ordering a CV Procedure (e.g. stress test, cath, DCCV, TEE, etc)?   Press F2        :[id number]}  Dispo: ***  Signed, Sharlene Dory, PA-C

## 2023-04-27 ENCOUNTER — Ambulatory Visit: Payer: BC Managed Care – PPO | Attending: Physician Assistant | Admitting: Physician Assistant

## 2023-04-27 DIAGNOSIS — E785 Hyperlipidemia, unspecified: Secondary | ICD-10-CM

## 2023-04-27 DIAGNOSIS — F419 Anxiety disorder, unspecified: Secondary | ICD-10-CM

## 2023-04-27 DIAGNOSIS — I10 Essential (primary) hypertension: Secondary | ICD-10-CM

## 2023-04-27 DIAGNOSIS — R0601 Orthopnea: Secondary | ICD-10-CM

## 2023-04-27 DIAGNOSIS — R072 Precordial pain: Secondary | ICD-10-CM

## 2023-04-28 ENCOUNTER — Encounter: Payer: Self-pay | Admitting: Physician Assistant
